# Patient Record
Sex: Male | Born: 1960 | Race: Black or African American | Hispanic: No | Marital: Married | Smoking: Former smoker
Health system: Southern US, Community
[De-identification: ages and names within clinical notes are randomized; demographics above are authoritative.]

## PROBLEM LIST (undated history)

## (undated) DIAGNOSIS — J984 Other disorders of lung: Secondary | ICD-10-CM

## (undated) DIAGNOSIS — C109 Malignant neoplasm of oropharynx, unspecified: Principal | ICD-10-CM

## (undated) HISTORY — DX: Malignant neoplasm of oropharynx, unspecified: C10.9

## (undated) HISTORY — PX: COLONOSCOPY: SHX174

## (undated) HISTORY — PX: DENTAL RESTORATION/EXTRACTION WITH X-RAY: SHX5796

## (undated) HISTORY — DX: Other disorders of lung: J98.4

## (undated) HISTORY — PX: PORT-A-CATH REMOVAL: SHX5289

## (undated) HISTORY — PX: PEG TUBE REMOVAL: SHX2187

---

## 2009-06-03 ENCOUNTER — Emergency Department (HOSPITAL_COMMUNITY): Admission: EM | Admit: 2009-06-03 | Discharge: 2009-06-03 | Payer: Self-pay | Admitting: Emergency Medicine

## 2009-08-13 ENCOUNTER — Ambulatory Visit: Admission: RE | Admit: 2009-08-13 | Discharge: 2009-10-14 | Payer: Self-pay | Admitting: Radiation Oncology

## 2009-08-17 ENCOUNTER — Ambulatory Visit: Payer: Self-pay | Admitting: Dentistry

## 2009-08-17 ENCOUNTER — Encounter: Admission: AD | Admit: 2009-08-17 | Discharge: 2009-08-17 | Payer: Self-pay | Admitting: Dentistry

## 2009-08-17 ENCOUNTER — Ambulatory Visit: Payer: Self-pay | Admitting: Oncology

## 2009-08-20 ENCOUNTER — Ambulatory Visit (HOSPITAL_COMMUNITY): Admission: RE | Admit: 2009-08-20 | Discharge: 2009-08-21 | Payer: Self-pay | Admitting: Dentistry

## 2009-08-24 ENCOUNTER — Ambulatory Visit (HOSPITAL_COMMUNITY): Admission: RE | Admit: 2009-08-24 | Discharge: 2009-08-24 | Payer: Self-pay | Admitting: Oncology

## 2009-08-31 ENCOUNTER — Ambulatory Visit (HOSPITAL_COMMUNITY): Admission: RE | Admit: 2009-08-31 | Discharge: 2009-08-31 | Payer: Self-pay | Admitting: Oncology

## 2009-09-01 ENCOUNTER — Ambulatory Visit (HOSPITAL_COMMUNITY): Admission: RE | Admit: 2009-09-01 | Discharge: 2009-09-01 | Payer: Self-pay | Admitting: Oncology

## 2009-09-17 ENCOUNTER — Ambulatory Visit: Payer: Self-pay | Admitting: Oncology

## 2009-09-21 LAB — CBC WITH DIFFERENTIAL/PLATELET
BASO%: 0.1 % (ref 0.0–2.0)
EOS%: 0.7 % (ref 0.0–7.0)
HCT: 40.5 % (ref 38.4–49.9)
LYMPH%: 5.8 % — ABNORMAL LOW (ref 14.0–49.0)
MCH: 26.4 pg — ABNORMAL LOW (ref 27.2–33.4)
MCHC: 32.3 g/dL (ref 32.0–36.0)
NEUT%: 84.1 % — ABNORMAL HIGH (ref 39.0–75.0)
RBC: 4.96 10*6/uL (ref 4.20–5.82)
lymph#: 0.8 10*3/uL — ABNORMAL LOW (ref 0.9–3.3)

## 2009-09-21 LAB — COMPREHENSIVE METABOLIC PANEL
ALT: 27 U/L (ref 0–53)
AST: 27 U/L (ref 0–37)
Calcium: 11.1 mg/dL — ABNORMAL HIGH (ref 8.4–10.5)
Chloride: 100 mEq/L (ref 96–112)
Creatinine, Ser: 0.62 mg/dL (ref 0.40–1.50)
Potassium: 3.9 mEq/L (ref 3.5–5.3)
Sodium: 137 mEq/L (ref 135–145)
Total Protein: 7.8 g/dL (ref 6.0–8.3)

## 2009-09-24 ENCOUNTER — Ambulatory Visit (HOSPITAL_COMMUNITY): Admission: RE | Admit: 2009-09-24 | Discharge: 2009-09-24 | Payer: Self-pay | Admitting: Oncology

## 2009-09-28 LAB — COMPREHENSIVE METABOLIC PANEL
AST: 18 U/L (ref 0–37)
Albumin: 3.6 g/dL (ref 3.5–5.2)
Alkaline Phosphatase: 101 U/L (ref 39–117)
Potassium: 4.4 mEq/L (ref 3.5–5.3)
Sodium: 137 mEq/L (ref 135–145)
Total Protein: 6.5 g/dL (ref 6.0–8.3)

## 2009-09-28 LAB — CBC WITH DIFFERENTIAL/PLATELET
BASO%: 0.2 % (ref 0.0–2.0)
EOS%: 1.3 % (ref 0.0–7.0)
HGB: 12.3 g/dL — ABNORMAL LOW (ref 13.0–17.1)
MCH: 26.3 pg — ABNORMAL LOW (ref 27.2–33.4)
MCHC: 32.5 g/dL (ref 32.0–36.0)
RDW: 12.9 % (ref 11.0–14.6)
lymph#: 0.4 10*3/uL — ABNORMAL LOW (ref 0.9–3.3)

## 2009-10-07 ENCOUNTER — Ambulatory Visit (HOSPITAL_COMMUNITY): Admission: RE | Admit: 2009-10-07 | Discharge: 2009-10-07 | Payer: Self-pay | Admitting: Oncology

## 2009-10-08 LAB — COMPREHENSIVE METABOLIC PANEL
AST: 31 U/L (ref 0–37)
Albumin: 3.5 g/dL (ref 3.5–5.2)
Alkaline Phosphatase: 118 U/L — ABNORMAL HIGH (ref 39–117)
BUN: 12 mg/dL (ref 6–23)
Potassium: 4.1 mEq/L (ref 3.5–5.3)
Total Bilirubin: 0.4 mg/dL (ref 0.3–1.2)

## 2009-10-08 LAB — CBC WITH DIFFERENTIAL/PLATELET
Basophils Absolute: 0 10*3/uL (ref 0.0–0.1)
EOS%: 0.1 % (ref 0.0–7.0)
HGB: 11.3 g/dL — ABNORMAL LOW (ref 13.0–17.1)
MCH: 27 pg — ABNORMAL LOW (ref 27.2–33.4)
MCV: 81.6 fL (ref 79.3–98.0)
MONO%: 9.3 % (ref 0.0–14.0)
RBC: 4.21 10*6/uL (ref 4.20–5.82)
RDW: 13 % (ref 11.0–14.6)

## 2009-10-19 ENCOUNTER — Ambulatory Visit: Payer: Self-pay | Admitting: Oncology

## 2009-10-19 LAB — COMPREHENSIVE METABOLIC PANEL
CO2: 28 mEq/L (ref 19–32)
Calcium: 8.9 mg/dL (ref 8.4–10.5)
Chloride: 104 mEq/L (ref 96–112)
Creatinine, Ser: 0.47 mg/dL (ref 0.40–1.50)
Sodium: 137 mEq/L (ref 135–145)
Total Bilirubin: 0.7 mg/dL (ref 0.3–1.2)
Total Protein: 6.2 g/dL (ref 6.0–8.3)

## 2009-10-19 LAB — CBC WITH DIFFERENTIAL/PLATELET
BASO%: 0.2 % (ref 0.0–2.0)
Eosinophils Absolute: 0 10*3/uL (ref 0.0–0.5)
HCT: 37.3 % — ABNORMAL LOW (ref 38.4–49.9)
MCH: 26.5 pg — ABNORMAL LOW (ref 27.2–33.4)
MCV: 83.8 fL (ref 79.3–98.0)
MONO%: 7.8 % (ref 0.0–14.0)
NEUT#: 4.3 10*3/uL (ref 1.5–6.5)
NEUT%: 77 % — ABNORMAL HIGH (ref 39.0–75.0)
Platelets: 197 10*3/uL (ref 140–400)
WBC: 5.5 10*3/uL (ref 4.0–10.3)

## 2009-11-09 LAB — COMPREHENSIVE METABOLIC PANEL
ALT: 14 U/L (ref 0–53)
AST: 23 U/L (ref 0–37)
Alkaline Phosphatase: 99 U/L (ref 39–117)
CO2: 26 mEq/L (ref 19–32)
Chloride: 106 mEq/L (ref 96–112)
Creatinine, Ser: 0.57 mg/dL (ref 0.40–1.50)
Glucose, Bld: 109 mg/dL — ABNORMAL HIGH (ref 70–99)
Total Protein: 6.1 g/dL (ref 6.0–8.3)

## 2009-11-09 LAB — CBC WITH DIFFERENTIAL/PLATELET
BASO%: 0.1 % (ref 0.0–2.0)
Basophils Absolute: 0 10*3/uL (ref 0.0–0.1)
EOS%: 0.4 % (ref 0.0–7.0)
HGB: 11.4 g/dL — ABNORMAL LOW (ref 13.0–17.1)
LYMPH%: 9.3 % — ABNORMAL LOW (ref 14.0–49.0)
MCV: 85.5 fL (ref 79.3–98.0)
MONO%: 9.5 % (ref 0.0–14.0)
NEUT%: 80.7 % — ABNORMAL HIGH (ref 39.0–75.0)
RBC: 4.2 10*6/uL (ref 4.20–5.82)
RDW: 17.7 % — ABNORMAL HIGH (ref 11.0–14.6)
WBC: 7.3 10*3/uL (ref 4.0–10.3)
lymph#: 0.7 10*3/uL — ABNORMAL LOW (ref 0.9–3.3)

## 2009-11-11 ENCOUNTER — Ambulatory Visit: Admission: RE | Admit: 2009-11-11 | Discharge: 2010-01-26 | Payer: Self-pay | Admitting: Radiation Oncology

## 2009-11-19 ENCOUNTER — Ambulatory Visit (HOSPITAL_COMMUNITY): Admission: RE | Admit: 2009-11-19 | Discharge: 2009-11-19 | Payer: Self-pay | Admitting: Radiation Oncology

## 2009-12-10 ENCOUNTER — Ambulatory Visit: Payer: Self-pay | Admitting: Oncology

## 2009-12-10 LAB — CBC WITH DIFFERENTIAL/PLATELET
BASO%: 0 % (ref 0.0–2.0)
EOS%: 5.5 % (ref 0.0–7.0)
HCT: 35.7 % — ABNORMAL LOW (ref 38.4–49.9)
MCH: 28.1 pg (ref 27.2–33.4)
MCHC: 32.5 g/dL (ref 32.0–36.0)
NEUT%: 71.7 % (ref 39.0–75.0)
RBC: 4.13 10*6/uL — ABNORMAL LOW (ref 4.20–5.82)
WBC: 4.7 10*3/uL (ref 4.0–10.3)
lymph#: 0.6 10*3/uL — ABNORMAL LOW (ref 0.9–3.3)
nRBC: 0 % (ref 0–0)

## 2009-12-10 LAB — COMPREHENSIVE METABOLIC PANEL
ALT: 8 U/L (ref 0–53)
AST: 16 U/L (ref 0–37)
Alkaline Phosphatase: 91 U/L (ref 39–117)
BUN: 13 mg/dL (ref 6–23)
Creatinine, Ser: 0.64 mg/dL (ref 0.40–1.50)
Potassium: 4.1 mEq/L (ref 3.5–5.3)

## 2009-12-17 LAB — COMPREHENSIVE METABOLIC PANEL
ALT: 11 U/L (ref 0–53)
Albumin: 4.4 g/dL (ref 3.5–5.2)
Alkaline Phosphatase: 83 U/L (ref 39–117)
CO2: 22 mEq/L (ref 19–32)
Calcium: 9 mg/dL (ref 8.4–10.5)
Creatinine, Ser: 1.83 mg/dL — ABNORMAL HIGH (ref 0.40–1.50)

## 2009-12-17 LAB — CBC WITH DIFFERENTIAL/PLATELET
BASO%: 0.2 % (ref 0.0–2.0)
Basophils Absolute: 0 10*3/uL (ref 0.0–0.1)
EOS%: 3.5 % (ref 0.0–7.0)
Eosinophils Absolute: 0.2 10*3/uL (ref 0.0–0.5)
MCH: 28.6 pg (ref 27.2–33.4)
MCV: 87.3 fL (ref 79.3–98.0)
MONO%: 9.5 % (ref 0.0–14.0)
NEUT%: 77.4 % — ABNORMAL HIGH (ref 39.0–75.0)
lymph#: 0.5 10*3/uL — ABNORMAL LOW (ref 0.9–3.3)
nRBC: 0 % (ref 0–0)

## 2009-12-23 LAB — CBC WITH DIFFERENTIAL/PLATELET
Eosinophils Absolute: 0.3 10*3/uL (ref 0.0–0.5)
HCT: 36 % — ABNORMAL LOW (ref 38.4–49.9)
HGB: 12.1 g/dL — ABNORMAL LOW (ref 13.0–17.1)
LYMPH%: 5.2 % — ABNORMAL LOW (ref 14.0–49.0)
MCH: 29.9 pg (ref 27.2–33.4)
MCHC: 33.7 g/dL (ref 32.0–36.0)
MONO#: 0.5 10*3/uL (ref 0.1–0.9)
NEUT#: 4.1 10*3/uL (ref 1.5–6.5)
NEUT%: 80.5 % — ABNORMAL HIGH (ref 39.0–75.0)

## 2009-12-23 LAB — COMPREHENSIVE METABOLIC PANEL
ALT: 8 U/L (ref 0–53)
AST: 13 U/L (ref 0–37)
Alkaline Phosphatase: 80 U/L (ref 39–117)
Potassium: 4 mEq/L (ref 3.5–5.3)
Sodium: 140 mEq/L (ref 135–145)
Total Bilirubin: 0.6 mg/dL (ref 0.3–1.2)

## 2009-12-23 LAB — MAGNESIUM: Magnesium: 1.9 mg/dL (ref 1.5–2.5)

## 2009-12-30 LAB — CBC WITH DIFFERENTIAL/PLATELET
BASO%: 0 % (ref 0.0–2.0)
Basophils Absolute: 0 10*3/uL (ref 0.0–0.1)
EOS%: 4.6 % (ref 0.0–7.0)
HGB: 12 g/dL — ABNORMAL LOW (ref 13.0–17.1)
LYMPH%: 3.9 % — ABNORMAL LOW (ref 14.0–49.0)
MCH: 29.9 pg (ref 27.2–33.4)
MCHC: 33.3 g/dL (ref 32.0–36.0)
MONO%: 10 % (ref 0.0–14.0)
NEUT%: 81.5 % — ABNORMAL HIGH (ref 39.0–75.0)
Platelets: 145 10*3/uL (ref 140–400)
RBC: 4.01 10*6/uL — ABNORMAL LOW (ref 4.20–5.82)
RDW: 14.1 % (ref 11.0–14.6)

## 2009-12-30 LAB — COMPREHENSIVE METABOLIC PANEL
ALT: 10 U/L (ref 0–53)
AST: 14 U/L (ref 0–37)
Creatinine, Ser: 0.7 mg/dL (ref 0.40–1.50)
Glucose, Bld: 90 mg/dL (ref 70–99)
Potassium: 4.2 mEq/L (ref 3.5–5.3)
Total Protein: 6.9 g/dL (ref 6.0–8.3)

## 2009-12-30 LAB — MAGNESIUM: Magnesium: 1.9 mg/dL (ref 1.5–2.5)

## 2010-01-04 ENCOUNTER — Ambulatory Visit: Payer: Self-pay | Admitting: Oncology

## 2010-01-06 ENCOUNTER — Ambulatory Visit: Payer: Self-pay | Admitting: Dentistry

## 2010-01-06 LAB — CBC WITH DIFFERENTIAL/PLATELET
BASO%: 0 % (ref 0.0–2.0)
EOS%: 4.1 % (ref 0.0–7.0)
Eosinophils Absolute: 0.2 10*3/uL (ref 0.0–0.5)
HCT: 34.9 % — ABNORMAL LOW (ref 38.4–49.9)
HGB: 11.9 g/dL — ABNORMAL LOW (ref 13.0–17.1)
LYMPH%: 2.3 % — ABNORMAL LOW (ref 14.0–49.0)
MONO#: 0.3 10*3/uL (ref 0.1–0.9)
MONO%: 6.4 % (ref 0.0–14.0)
NEUT%: 87.2 % — ABNORMAL HIGH (ref 39.0–75.0)
Platelets: 112 10*3/uL — ABNORMAL LOW (ref 140–400)
WBC: 5.4 10*3/uL (ref 4.0–10.3)
lymph#: 0.1 10*3/uL — ABNORMAL LOW (ref 0.9–3.3)

## 2010-01-06 LAB — COMPREHENSIVE METABOLIC PANEL
ALT: 10 U/L (ref 0–53)
Alkaline Phosphatase: 73 U/L (ref 39–117)
CO2: 28 mEq/L (ref 19–32)
Potassium: 4.3 mEq/L (ref 3.5–5.3)
Total Protein: 6.5 g/dL (ref 6.0–8.3)

## 2010-01-13 LAB — COMPREHENSIVE METABOLIC PANEL
ALT: 9 U/L (ref 0–53)
AST: 14 U/L (ref 0–37)
Albumin: 4.3 g/dL (ref 3.5–5.2)
Alkaline Phosphatase: 81 U/L (ref 39–117)
BUN: 14 mg/dL (ref 6–23)
CO2: 24 mEq/L (ref 19–32)
Calcium: 9 mg/dL (ref 8.4–10.5)
Glucose, Bld: 126 mg/dL — ABNORMAL HIGH (ref 70–99)
Sodium: 140 mEq/L (ref 135–145)
Total Bilirubin: 0.6 mg/dL (ref 0.3–1.2)
Total Protein: 6.9 g/dL (ref 6.0–8.3)

## 2010-01-13 LAB — CBC WITH DIFFERENTIAL/PLATELET
BASO%: 0 % (ref 0.0–2.0)
Basophils Absolute: 0 10*3/uL (ref 0.0–0.1)
Eosinophils Absolute: 0.1 10*3/uL (ref 0.0–0.5)
NEUT#: 5.3 10*3/uL (ref 1.5–6.5)
Platelets: 84 10*3/uL — ABNORMAL LOW (ref 140–400)
lymph#: 0.1 10*3/uL — ABNORMAL LOW (ref 0.9–3.3)

## 2010-01-13 LAB — MAGNESIUM: Magnesium: 1.9 mg/dL (ref 1.5–2.5)

## 2010-01-20 LAB — CBC WITH DIFFERENTIAL/PLATELET
BASO%: 0 % (ref 0.0–2.0)
Eosinophils Absolute: 0.1 10*3/uL (ref 0.0–0.5)
HCT: 32.9 % — ABNORMAL LOW (ref 38.4–49.9)
HGB: 11.4 g/dL — ABNORMAL LOW (ref 13.0–17.1)
MCH: 30.7 pg (ref 27.2–33.4)
MCV: 88.7 fL (ref 79.3–98.0)
MONO#: 0.2 10*3/uL (ref 0.1–0.9)
MONO%: 8 % (ref 0.0–14.0)
NEUT%: 86.1 % — ABNORMAL HIGH (ref 39.0–75.0)
lymph#: 0.1 10*3/uL — ABNORMAL LOW (ref 0.9–3.3)

## 2010-01-20 LAB — COMPREHENSIVE METABOLIC PANEL
ALT: 9 U/L (ref 0–53)
AST: 16 U/L (ref 0–37)
BUN: 15 mg/dL (ref 6–23)
Calcium: 9.2 mg/dL (ref 8.4–10.5)
Glucose, Bld: 88 mg/dL (ref 70–99)
Potassium: 3.9 mEq/L (ref 3.5–5.3)
Sodium: 138 mEq/L (ref 135–145)
Total Protein: 6.5 g/dL (ref 6.0–8.3)

## 2010-02-17 ENCOUNTER — Ambulatory Visit: Payer: Self-pay | Admitting: Oncology

## 2010-02-18 LAB — CBC WITH DIFFERENTIAL/PLATELET
BASO%: 0 % (ref 0.0–2.0)
EOS%: 0.6 % (ref 0.0–7.0)
Eosinophils Absolute: 0 10*3/uL (ref 0.0–0.5)
HCT: 35.4 % — ABNORMAL LOW (ref 38.4–49.9)
HGB: 12 g/dL — ABNORMAL LOW (ref 13.0–17.1)
MCH: 30.5 pg (ref 27.2–33.4)
MCHC: 33.8 g/dL (ref 32.0–36.0)
MONO#: 0.5 10*3/uL (ref 0.1–0.9)
NEUT#: 5.6 10*3/uL (ref 1.5–6.5)
WBC: 6.4 10*3/uL (ref 4.0–10.3)

## 2010-02-18 LAB — COMPREHENSIVE METABOLIC PANEL
AST: 16 U/L (ref 0–37)
Calcium: 9.4 mg/dL (ref 8.4–10.5)
Creatinine, Ser: 0.75 mg/dL (ref 0.40–1.50)
Sodium: 138 mEq/L (ref 135–145)
Total Protein: 7.1 g/dL (ref 6.0–8.3)

## 2010-03-16 ENCOUNTER — Ambulatory Visit: Payer: Self-pay | Admitting: Dentistry

## 2010-04-26 ENCOUNTER — Ambulatory Visit: Payer: Self-pay | Admitting: Oncology

## 2010-04-27 ENCOUNTER — Encounter: Payer: Self-pay | Admitting: Pulmonary Disease

## 2010-04-27 ENCOUNTER — Ambulatory Visit (HOSPITAL_COMMUNITY): Admission: RE | Admit: 2010-04-27 | Discharge: 2010-04-27 | Payer: Self-pay | Admitting: Oncology

## 2010-04-28 ENCOUNTER — Encounter: Payer: Self-pay | Admitting: Pulmonary Disease

## 2010-04-28 LAB — CBC WITH DIFFERENTIAL/PLATELET
EOS%: 3.5 % (ref 0.0–7.0)
Eosinophils Absolute: 0.2 10*3/uL (ref 0.0–0.5)
HCT: 40.9 % (ref 38.4–49.9)
HGB: 13.6 g/dL (ref 13.0–17.1)
MCV: 85.4 fL (ref 79.3–98.0)
MONO%: 6.1 % (ref 0.0–14.0)
NEUT#: 4.2 10*3/uL (ref 1.5–6.5)
NEUT%: 83.1 % — ABNORMAL HIGH (ref 39.0–75.0)
Platelets: 170 10*3/uL (ref 140–400)
lymph#: 0.4 10*3/uL — ABNORMAL LOW (ref 0.9–3.3)

## 2010-04-28 LAB — COMPREHENSIVE METABOLIC PANEL
ALT: 11 U/L (ref 0–53)
Albumin: 4.5 g/dL (ref 3.5–5.2)
BUN: 13 mg/dL (ref 6–23)
Calcium: 9.7 mg/dL (ref 8.4–10.5)
Glucose, Bld: 169 mg/dL — ABNORMAL HIGH (ref 70–99)
Sodium: 139 mEq/L (ref 135–145)
Total Bilirubin: 0.5 mg/dL (ref 0.3–1.2)

## 2010-05-17 ENCOUNTER — Ambulatory Visit: Payer: Self-pay | Admitting: Pulmonary Disease

## 2010-05-17 DIAGNOSIS — R599 Enlarged lymph nodes, unspecified: Secondary | ICD-10-CM | POA: Insufficient documentation

## 2010-05-18 DIAGNOSIS — C109 Malignant neoplasm of oropharynx, unspecified: Secondary | ICD-10-CM

## 2010-05-18 DIAGNOSIS — J984 Other disorders of lung: Secondary | ICD-10-CM

## 2010-05-18 DIAGNOSIS — Z85818 Personal history of malignant neoplasm of other sites of lip, oral cavity, and pharynx: Secondary | ICD-10-CM | POA: Insufficient documentation

## 2010-05-18 HISTORY — DX: Malignant neoplasm of oropharynx, unspecified: C10.9

## 2010-05-18 HISTORY — DX: Other disorders of lung: J98.4

## 2010-05-19 ENCOUNTER — Ambulatory Visit (HOSPITAL_COMMUNITY): Admission: RE | Admit: 2010-05-19 | Discharge: 2010-05-19 | Payer: Self-pay | Admitting: Oncology

## 2010-07-26 ENCOUNTER — Ambulatory Visit: Payer: Self-pay | Admitting: Oncology

## 2010-08-04 ENCOUNTER — Ambulatory Visit (HOSPITAL_COMMUNITY): Admission: RE | Admit: 2010-08-04 | Discharge: 2010-08-04 | Payer: Self-pay | Admitting: Oncology

## 2010-08-09 ENCOUNTER — Encounter: Payer: Self-pay | Admitting: Pulmonary Disease

## 2010-08-09 LAB — COMPREHENSIVE METABOLIC PANEL
ALT: 12 U/L (ref 0–53)
Alkaline Phosphatase: 108 U/L (ref 39–117)
BUN: 16 mg/dL (ref 6–23)
Calcium: 9.3 mg/dL (ref 8.4–10.5)

## 2010-08-09 LAB — CBC WITH DIFFERENTIAL/PLATELET
BASO%: 0 % (ref 0.0–2.0)
Basophils Absolute: 0 10*3/uL (ref 0.0–0.1)
EOS%: 3.7 % (ref 0.0–7.0)
Eosinophils Absolute: 0.3 10*3/uL (ref 0.0–0.5)
HCT: 37.9 % — ABNORMAL LOW (ref 38.4–49.9)
MCHC: 33.5 g/dL (ref 32.0–36.0)
NEUT%: 84.6 % — ABNORMAL HIGH (ref 39.0–75.0)
Platelets: 164 10*3/uL (ref 140–400)
RBC: 4.5 10*6/uL (ref 4.20–5.82)
RDW: 13.3 % (ref 11.0–14.6)

## 2010-10-29 ENCOUNTER — Encounter: Payer: Self-pay | Admitting: Pulmonary Disease

## 2010-11-05 ENCOUNTER — Other Ambulatory Visit: Payer: Self-pay | Admitting: Oncology

## 2010-11-16 NOTE — Letter (Signed)
Summary: [facility]  [facility]   Imported By: Sherian Rein 08/30/2010 13:21:20  _____________________________________________________________________  External Attachment:    Type:   Image     Comment:   External Document

## 2010-11-16 NOTE — Letter (Signed)
[facility]   Imported By: Sherian Rein 05/14/2010 10:38:26  _____________________________________________________________________  External Attachment:    Type:   Image     Comment:   External Document

## 2010-11-16 NOTE — Assessment & Plan Note (Signed)
Summary: hist. of throat ca/bilateral lung nodules/apc   Visit Type:  Initial Consult Copy to:  DR HA  CC:  2 SMALL LYMPH NODULES INFLAMMED and FOLLOWUP FROM PET SCAN.  History of Present Illness: 50/M, ex smoker, for evaluation of mediastinal LNS. 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 he has a PEG & eats abou 0 soid food now. he has lost 20 lbs . PET scan on 04/27/10 reviewed, showed no evidence of residual tumor at the base of tongue, sub-cm rt posterior cervical LN low level ptake, UL 8 mm ground glass nodule SU 1.4, new bilateral symmetric DG uptake booth hila, lt 5.4 & Rt 3.6. Interval calcification of mediastinal & hilar nodes was noted. He reports sore throat & dryness of mouth. No skin rash   Preventive Screening-Counseling & Management  Alcohol-Tobacco     Smoking Status: quit  Current Medications (verified): 1)  Percocet 5-325 Mg Tabs (Oxycodone-Acetaminophen) .... As Needed  Allergies (verified): No Known Drug Allergies  Past History:  Past Medical History: CANCER  ORAL PHRYNX 08/11/09 CHEMO THERAPY AND RADIATION FROM 12/11 TO APRIL 4/11  Past Surgical History: ORAL SURGERYTEETH REMOVED 08/20/09 FOR CHEMOTHERAPY AND RADIATION TREATMENT  Family History: GASTRIC CA/FATHER Family History HypertensionFATHER Family History Lung Cancer GRANDFATHER  Social History: Patient states former smoker. 15 YEARS AGO Patient states former smoker.  Marital Status: MARRIED Children: YES Occupation: FULL TIME STUDENT Smoking Status:  quit  Review of Systems       The patient complains of non-productive cough and tooth/dental problems.  The patient denies shortness of breath with activity, shortness of breath at rest, productive cough, coughing up blood, chest pain, irregular heartbeats, acid heartburn, indigestion, loss of appetite, weight change, abdominal pain, difficulty swallowing, sore  throat, headaches, nasal congestion/difficulty breathing through nose, sneezing, itching, ear ache, anxiety, depression, hand/feet swelling, joint stiffness or pain, rash, change in color of mucus, and fever.    Vital Signs:  Patient profile:   50 year old male Height:      71 inches Weight:      172 pounds BMI:     24.08 O2 Sat:      97 % on Room air Temp:     98.1 degrees F oral Pulse rate:   73 / minute BP sitting:   96 / 68  (right arm) Cuff size:   regular  Vitals Entered By: Kandice Hams CMA (May 17, 2010 11:29 AM)  O2 Flow:  Room air  Physical Exam  Additional Exam:  Gen. Pleasant, well-nourished, in no distress, normal affect ENT - no lesions, no post nasal drip Neck: No JVD, no thyromegaly, no carotid bruits Lungs: no use of accessory muscles, no dullness to percussion, clear without rales or rhonchi  Cardiovascular: Rhythm regular, heart sounds  normal, no murmurs or gallops, no peripheral edema Abdomen: soft and non-tender, no hepatosplenomegaly, BS normal, pEG site clean Musculoskeletal: No deformities, no cyanosis or clubbing Neuro:  alert, non focal     Impression & Recommendations:  Problem # 1:  LYMPHADENOPATHY (ICD-785.6)  Calcification of mediastinal & hilar LNs favors a benign process - old infection, inflammation ? sarcoid Agree with obtaining ACE level  Orders: Consultation Level IV (16109)  Problem # 2:  PULMONARY NODULE, RIGHT UPPER LOBE (ICD-518.89)  Agree with 3-4 month FU with contrast CT or PET  Orders: Consultation Level IV (60454)  Medications Added to Medication List  This Visit: 1)  Percocet 5-325 Mg Tabs (Oxycodone-acetaminophen) .... As needed  Patient Instructions: 1)  Copy sent to:Dr Ha 2)  Please schedule a follow-up appointment in 3 months after scan to scheck on spot in right uppe rlung, neck nodes & chest lymph glands 3)  Obtian blood work from Dr Gaylyn Rong 4)  The lymph glands are calcified - this can indicate old infection,  inflammation or sarcoidosis

## 2010-11-18 NOTE — Letter (Signed)
Summary: [facility]  [facility]   Imported By: Sherian Rein 11/12/2010 07:53:05  _____________________________________________________________________  External Attachment:    Type:   Image     Comment:   External Document

## 2010-11-29 ENCOUNTER — Other Ambulatory Visit (HOSPITAL_COMMUNITY): Payer: Self-pay

## 2010-12-01 ENCOUNTER — Other Ambulatory Visit: Payer: Self-pay | Admitting: Oncology

## 2010-12-03 ENCOUNTER — Ambulatory Visit (HOSPITAL_COMMUNITY)
Admission: RE | Admit: 2010-12-03 | Discharge: 2010-12-03 | Disposition: A | Discharge status: Home / Self Care | Payer: PRIVATE HEALTH INSURANCE | Source: Ambulatory Visit | Attending: Oncology | Admitting: Oncology

## 2010-12-03 DIAGNOSIS — J438 Other emphysema: Secondary | ICD-10-CM | POA: Insufficient documentation

## 2010-12-03 DIAGNOSIS — C760 Malignant neoplasm of head, face and neck: Secondary | ICD-10-CM | POA: Insufficient documentation

## 2010-12-03 MED ORDER — IOHEXOL 300 MG/ML  SOLN
100.0000 mL | Freq: Once | INTRAMUSCULAR | Status: AC | PRN
  Administered 2010-12-03: 100 mL via INTRAVENOUS

## 2010-12-06 ENCOUNTER — Encounter (HOSPITAL_BASED_OUTPATIENT_CLINIC_OR_DEPARTMENT_OTHER): Payer: PRIVATE HEALTH INSURANCE | Admitting: Oncology

## 2010-12-06 ENCOUNTER — Encounter: Payer: Self-pay | Admitting: Pulmonary Disease

## 2010-12-06 DIAGNOSIS — B370 Candidal stomatitis: Secondary | ICD-10-CM

## 2010-12-06 DIAGNOSIS — C01 Malignant neoplasm of base of tongue: Secondary | ICD-10-CM

## 2010-12-06 DIAGNOSIS — Z23 Encounter for immunization: Secondary | ICD-10-CM

## 2010-12-31 LAB — CBC
HCT: 39.7 % (ref 39.0–52.0)
Hemoglobin: 13.3 g/dL (ref 13.0–17.0)
MCH: 28.8 pg (ref 26.0–34.0)
MCHC: 33.6 g/dL (ref 30.0–36.0)

## 2011-01-04 NOTE — Letter (Signed)
Summary: [facility]  [facility]   Imported By: Lennie Odor 12/30/2010 12:16:23  _____________________________________________________________________  External Attachment:    Type:   Image     Comment:   External Document

## 2011-01-19 LAB — CBC
HCT: 33.5 % — ABNORMAL LOW (ref 39.0–52.0)
HCT: 39.8 % (ref 39.0–52.0)
MCHC: 33.5 g/dL (ref 30.0–36.0)
MCV: 80.7 fL (ref 78.0–100.0)
Platelets: 252 10*3/uL (ref 150–400)
Platelets: 293 10*3/uL (ref 150–400)
RBC: 4.14 MIL/uL — ABNORMAL LOW (ref 4.22–5.81)
RDW: 12.5 % (ref 11.5–15.5)
WBC: 7.5 10*3/uL (ref 4.0–10.5)

## 2011-01-19 LAB — CULTURE, BLOOD (ROUTINE X 2): Culture: NO GROWTH

## 2011-01-19 LAB — COMPREHENSIVE METABOLIC PANEL
Albumin: 3.8 g/dL (ref 3.5–5.2)
BUN: 13 mg/dL (ref 6–23)
Calcium: 9.6 mg/dL (ref 8.4–10.5)
Creatinine, Ser: 0.76 mg/dL (ref 0.4–1.5)
Total Bilirubin: 0.5 mg/dL (ref 0.3–1.2)
Total Protein: 6.9 g/dL (ref 6.0–8.3)

## 2011-01-19 LAB — BASIC METABOLIC PANEL
BUN: 8 mg/dL (ref 6–23)
Chloride: 102 mEq/L (ref 96–112)
Creatinine, Ser: 0.76 mg/dL (ref 0.4–1.5)
GFR calc Af Amer: >60 mL/min (ref >60)
GFR calc non Af Amer: >60 mL/min (ref >60)
Potassium: 3.9 mEq/L (ref 3.5–5.1)

## 2011-01-19 LAB — DIFFERENTIAL
Basophils Absolute: 0 10*3/uL (ref 0.0–0.1)
Basophils Relative: 0 % (ref 0–1)
Lymphocytes Relative: 4 % — ABNORMAL LOW (ref 12–46)
Monocytes Absolute: 0.5 10*3/uL (ref 0.1–1.0)
Neutro Abs: 12.2 10*3/uL — ABNORMAL HIGH (ref 1.7–7.7)
Neutrophils Relative %: 91 % — ABNORMAL HIGH (ref 43–77)

## 2011-01-19 LAB — APTT: aPTT: 36 seconds (ref 24–37)

## 2011-01-19 LAB — PROTIME-INR: Prothrombin Time: 14.4 seconds (ref 11.6–15.2)

## 2011-01-19 LAB — GLUCOSE, CAPILLARY: Glucose-Capillary: 128 mg/dL — ABNORMAL HIGH (ref 70–99)

## 2011-03-28 ENCOUNTER — Encounter (HOSPITAL_BASED_OUTPATIENT_CLINIC_OR_DEPARTMENT_OTHER): Payer: PRIVATE HEALTH INSURANCE | Admitting: Oncology

## 2011-03-28 ENCOUNTER — Other Ambulatory Visit: Payer: Self-pay | Admitting: Oncology

## 2011-03-28 ENCOUNTER — Ambulatory Visit (HOSPITAL_COMMUNITY)
Admission: RE | Admit: 2011-03-28 | Discharge: 2011-03-28 | Disposition: A | Discharge status: Home / Self Care | Payer: PRIVATE HEALTH INSURANCE | Source: Ambulatory Visit | Attending: Oncology | Admitting: Oncology

## 2011-03-28 DIAGNOSIS — C01 Malignant neoplasm of base of tongue: Secondary | ICD-10-CM

## 2011-03-28 DIAGNOSIS — C029 Malignant neoplasm of tongue, unspecified: Secondary | ICD-10-CM | POA: Insufficient documentation

## 2011-03-28 LAB — CMP (CANCER CENTER ONLY)
AST: 48 U/L — ABNORMAL HIGH (ref 11–38)
Albumin: 3.5 g/dL (ref 3.3–5.5)
BUN, Bld: 15 mg/dL (ref 7–22)
CO2: 31 mEq/L (ref 18–33)
Calcium: 9.2 mg/dL (ref 8.0–10.3)
Chloride: 101 mEq/L (ref 98–108)
Glucose, Bld: 107 mg/dL (ref 73–118)
Potassium: 4.3 mEq/L (ref 3.3–4.7)

## 2011-03-28 LAB — CBC WITH DIFFERENTIAL/PLATELET
Basophils Absolute: 0 10*3/uL (ref 0.0–0.1)
Eosinophils Absolute: 0.3 10*3/uL (ref 0.0–0.5)
HCT: 39 % (ref 38.4–49.9)
HGB: 13 g/dL (ref 13.0–17.1)
MONO#: 0.4 10*3/uL (ref 0.1–0.9)
NEUT#: 4.4 10*3/uL (ref 1.5–6.5)
NEUT%: 78.9 % — ABNORMAL HIGH (ref 39.0–75.0)
RDW: 13.1 % (ref 11.0–14.6)
lymph#: 0.5 10*3/uL — ABNORMAL LOW (ref 0.9–3.3)

## 2011-03-28 MED ORDER — IOHEXOL 300 MG/ML  SOLN
100.0000 mL | Freq: Once | INTRAMUSCULAR | Status: AC | PRN
  Administered 2011-03-28: 100 mL via INTRAVENOUS

## 2011-04-04 ENCOUNTER — Other Ambulatory Visit: Payer: Self-pay | Admitting: Oncology

## 2011-04-04 ENCOUNTER — Encounter (HOSPITAL_BASED_OUTPATIENT_CLINIC_OR_DEPARTMENT_OTHER): Payer: PRIVATE HEALTH INSURANCE | Admitting: Oncology

## 2011-04-04 DIAGNOSIS — C01 Malignant neoplasm of base of tongue: Secondary | ICD-10-CM

## 2011-04-04 DIAGNOSIS — C029 Malignant neoplasm of tongue, unspecified: Secondary | ICD-10-CM

## 2011-05-13 ENCOUNTER — Ambulatory Visit: Payer: PRIVATE HEALTH INSURANCE | Attending: Radiation Oncology | Admitting: Radiation Oncology

## 2011-07-26 IMAGING — PT NM PET TUM IMG INITIAL (PI) SKULL BASE T - THIGH
4 series · 25 of 25 positions shown · non-contrast
Comparison: None

CLINICAL DATA: Tongue base cancer.

NUCLEAR MEDICINE PET/CT
TECHNIQUE: 13.6 mCi F-18 FDG was injected intravenously.  Full-
ring PET imaging was performed from the skull base through the mid-
thighs.  CT data was obtained and used for attenuation correction
and anatomic localization only.  (This was not acquired as a
diagnostic CT examination.)  Data regarding site of injection,
patient weight, post injection waiting period, and fasting blood
sugar is available in the PACS documentation. Dedicated PET imaging
of the neck region was also performed.  Any standard uptake value
measurements in the neck region are taken from these dedicated neck
images.

[Series 1: pet ac · axial · 3.3mm · 4.69mm/px · z∈[-908,-38]mm · 8 of 267 slices shown]
[im 1/267]
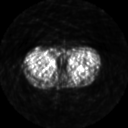
[im 39/267]
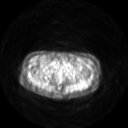
[im 77/267]
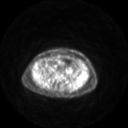
[im 115/267]
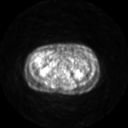
[im 153/267]
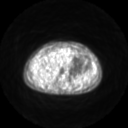
[im 191/267]
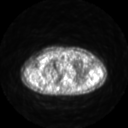
[im 229/267]
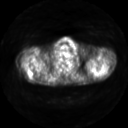
[im 267/267]
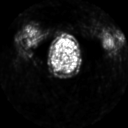

[Series 2: pet nac · axial · 3.3mm · 4.69mm/px · z∈[-908,-38]mm · 8 of 267 slices shown]
[im 1/267]
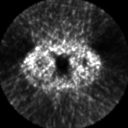
[im 39/267]
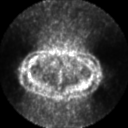
[im 77/267]
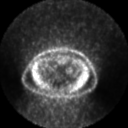
[im 115/267]
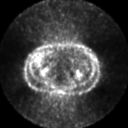
[im 153/267]
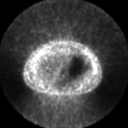
[im 191/267]
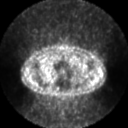
[im 229/267]
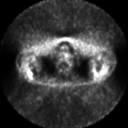
[im 267/267]
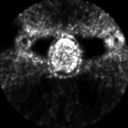

[Series 123: mip · coronal · 3.3mm · 4.69mm/px · 1 of 30 slices shown]
[im 1/30]
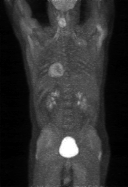

[Series 151: reformatted · axial · 3.3mm · 3.91mm/px · z∈[-908,-38]mm · 8 of 267 slices shown]
[im 1/267]
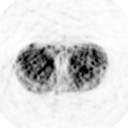
[im 39/267]
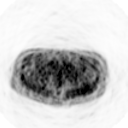
[im 77/267]
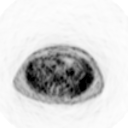
[im 115/267]
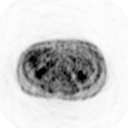
[im 153/267]
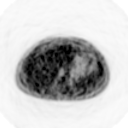
[im 191/267]
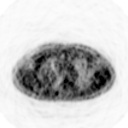
[im 229/267]
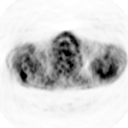
[im 267/267]
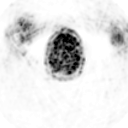

[25 of 25 positions shown; findings below may reference images not displayed]

FINDINGS: A mass in the left tongue base which appears to have
ulceration and central necrosis there has a maximum standard uptake
value of 14.0 and somewhat effaces the left vallecula.  Superiorly,
this mass extends up on the left side of the nasopharynx nearly to
the level of the maxilla.  Posteriorly, this mass extends to the
left side of the pharynx and nearly reaches the anterior margin of
the C2 vertebral body.  Inferiorly, the mass extends nearly to the
hyoid bone.

No definite hyper metabolic adenopathy is identified in the neck,
although lymph nodes measure up to 1.0 cm in short axis dimension
(station II on the left).

There is considerable muscular activity in the neck and left
shoulder, and also of the right first intercostal space.

A hypodense lesion in the medial segment left hepatic lobe
measuring 1.6 cm in diameter is not hypermetabolic and thus is
likely be benign.

An abnormal low density lesion is present along the descending
duodenum, and is intimately associated with the common bile duct
and possibly the pancreatic head.  Differential diagnostic
considerations include a foregut duplication cyst, choledochal
cyst, or a cystic mass exophytic from the head of the pancreas.
MRI of the pancreas with contrast and with dedicated MRCP images is
recommended if this is not a known benign finding.  I would doubt
that this would represent metastatic disease from the head/neck
primary, and the lesion is not overtly hypermetabolic, with a
maximum standard uptake value of 1.5.  However, please note that
low grade cystic neoplasm of the pancreas can have low activity on
PET scan.

No findings of metastatic disease to the pelvis.

There is mild chronic left ethmoid sinusitis.  There is a
suggestion of recent maxillary tooth extractions, with some
cortical indistinctness of the left posterior maxilla.

Patchy ground-glass opacities are present in the right lung.  The
appearance is less striking than on the recent chest radiograph,
and may represent improving pneumonia, asymmetric edema, or
aspiration pneumonitis.
IMPRESSION: 1.  Hypermetabolic left tongue base mass compatible with
malignancy.  No definite hypermetabolic adenopathy in the neck
although individual lymph nodes measure up to 1.0 cm in short axis
dimension.
2.  Patchy ground-glass opacities in the right lung, possibly
representing aspiration pneumonitis, improving pneumonia, or
asymmetric edema.
3.  Cystic lesion along the right margin of the head of the
pancreas, with differential diagnostic considerations including
choledochal cyst, foregut duplication cyst, or cystic mass
exophytic in the head of the pancreas.  Given the appearance I
would doubt that this represents a duodenal diverticulum.  MRI of
the pancreas with and without contrast and with dedicated MRCP
images is recommended if this has not been previously worked up.
4.  Mild chronic left ethmoid sinusitis.
5.  Recent tooth extraction from the maxilla, with some cortical
indistinctness of the left posterior maxilla.
6.  Non-hypermetabolic hypodense lesion in the medial segment left
hepatic lobe is likely benign.

## 2011-09-19 ENCOUNTER — Encounter: Payer: Self-pay | Admitting: *Deleted

## 2011-09-22 ENCOUNTER — Other Ambulatory Visit: Payer: Self-pay | Admitting: *Deleted

## 2011-09-22 DIAGNOSIS — C01 Malignant neoplasm of base of tongue: Secondary | ICD-10-CM

## 2011-09-23 ENCOUNTER — Ambulatory Visit (HOSPITAL_COMMUNITY)
Admission: RE | Admit: 2011-09-23 | Discharge: 2011-09-23 | Disposition: A | Discharge status: Home / Self Care | Payer: PRIVATE HEALTH INSURANCE | Source: Ambulatory Visit | Attending: Oncology | Admitting: Oncology

## 2011-09-23 ENCOUNTER — Other Ambulatory Visit (HOSPITAL_BASED_OUTPATIENT_CLINIC_OR_DEPARTMENT_OTHER): Payer: PRIVATE HEALTH INSURANCE | Admitting: Lab

## 2011-09-23 ENCOUNTER — Other Ambulatory Visit: Payer: Self-pay | Admitting: Oncology

## 2011-09-23 ENCOUNTER — Encounter: Payer: Self-pay | Admitting: Oncology

## 2011-09-23 DIAGNOSIS — C01 Malignant neoplasm of base of tongue: Secondary | ICD-10-CM

## 2011-09-23 DIAGNOSIS — D6959 Other secondary thrombocytopenia: Secondary | ICD-10-CM

## 2011-09-23 DIAGNOSIS — C029 Malignant neoplasm of tongue, unspecified: Secondary | ICD-10-CM

## 2011-09-23 LAB — CMP (CANCER CENTER ONLY)
AST: 29 U/L (ref 11–38)
Alkaline Phosphatase: 84 U/L (ref 26–84)
BUN, Bld: 20 mg/dL (ref 7–22)
Creat: 1.1 mg/dl (ref 0.6–1.2)
Glucose, Bld: 78 mg/dL (ref 73–118)
Total Bilirubin: 0.6 mg/dl (ref 0.20–1.60)

## 2011-09-23 LAB — CBC WITH DIFFERENTIAL/PLATELET
Basophils Absolute: 0 10*3/uL (ref 0.0–0.1)
EOS%: 4.2 % (ref 0.0–7.0)
HCT: 41 % (ref 38.4–49.9)
HGB: 13.6 g/dL (ref 13.0–17.1)
LYMPH%: 13.2 % — ABNORMAL LOW (ref 14.0–49.0)
MCH: 27 pg — ABNORMAL LOW (ref 27.2–33.4)
MCV: 81.5 fL (ref 79.3–98.0)
MONO%: 6.6 % (ref 0.0–14.0)
NEUT%: 75.9 % — ABNORMAL HIGH (ref 39.0–75.0)
Platelets: 174 10*3/uL (ref 140–400)
RDW: 13 % (ref 11.0–14.6)

## 2011-09-23 MED ORDER — IOHEXOL 300 MG/ML  SOLN
100.0000 mL | Freq: Once | INTRAMUSCULAR | Status: AC | PRN
  Administered 2011-09-23: 100 mL via INTRAVENOUS

## 2011-09-29 ENCOUNTER — Telehealth: Payer: Self-pay | Admitting: Oncology

## 2011-09-29 ENCOUNTER — Ambulatory Visit (HOSPITAL_BASED_OUTPATIENT_CLINIC_OR_DEPARTMENT_OTHER): Payer: PRIVATE HEALTH INSURANCE | Admitting: Oncology

## 2011-09-29 VITALS — BP 116/71 | HR 57 | Temp 97.1°F | Ht 71.0 in | Wt 182.5 lb

## 2011-09-29 DIAGNOSIS — C01 Malignant neoplasm of base of tongue: Secondary | ICD-10-CM

## 2011-09-29 DIAGNOSIS — K117 Disturbances of salivary secretion: Secondary | ICD-10-CM

## 2011-09-29 DIAGNOSIS — C109 Malignant neoplasm of oropharynx, unspecified: Secondary | ICD-10-CM

## 2011-09-29 NOTE — Progress Notes (Signed)
[facility] OFFICE PROGRESS NOTE  No primary provider on file.  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  PAST THERAPY:  Induction chemotherapy Cisplatin/Taxotere/5FU started on 09/21/2009.  He received concurrent chemoXRT with weekly Carboplatin which finished on 01/26/2010.  CURRENT THERAPY:  watchful observation.   INTERVAL HISTORY: Shane Webster 50 y.o. male returns for regular follow up with his wife.  He has residual xerostomia; however, he denies dysphagia, odynophagia, nausea/vomiting, weight loss, anorexia.  He is due to finish school in a few months.  He denies fatigue, palpable nodes, chest pain, abd pain.  He does not smoke or drink.  Patient denies fatigue, headache, visual changes, confusion, drenching night sweats, palpable lymph node swelling, mucositis, odynophagia, dysphagia, nausea vomiting, jaundice, chest pain, palpitation, shortness of breath, dyspnea on exertion, productive cough, gum bleeding, epistaxis, hematemesis, hemoptysis, abdominal pain, abdominal swelling, early satiety, melena, hematochezia, hematuria, skin rash, spontaneous bleeding, joint swelling, joint pain, heat or cold intolerance, bowel bladder incontinence, back pain, focal motor weakness, paresthesia, depression, suicidal or homocidal ideation, feeling hopelessness.   MEDICAL HISTORY: Past Medical History  Diagnosis Date  . MALIGNANT NEOPLASM OROPHARYNX UNSPECIFIED SITE 05/18/2010  . PULMONARY NODULE, RIGHT UPPER LOBE 05/18/2010    SURGICAL HISTORY: No past surgical history on file.  MEDICATIONS: No current outpatient prescriptions on file.    ALLERGIES:   has no known allergies.  REVIEW OF SYSTEMS:  The rest of the 14-point review of system was negative.   Filed Vitals:   09/29/11 1339  BP: 116/71  Pulse: 57  Temp: 97.1 F (36.2 C)   Wt Readings from Last 3 Encounters:  09/29/11 182 lb 8 oz (82.781 kg)    04/04/11 175 lb 3.2 oz (79.47 kg)  05/17/10 172 lb (78.019 kg)   ECOG Performance status:  0  PHYSICAL EXAMINATION:   General:  well-nourished in no acute distress.  Eyes:  no scleral icterus.  ENT:  There were no oropharyngeal lesions.  Neck was without thyromegaly.  Lymphatics:  Negative cervical, supraclavicular or axillary adenopathy.  Respiratory: lungs were clear bilaterally without wheezing or crackles.  Cardiovascular:  Regular rate and rhythm, S1/S2, without murmur, rub or gallop.  There was no pedal edema.  GI:  abdomen was soft, flat, nontender, nondistended, without organomegaly.  Muscoloskeletal:  no spinal tenderness of palpation of vertebral spine.  Skin exam was without echymosis, petichae.  Neuro exam was nonfocal.  Patient was able to get on and off exam table without assistance.  Gait was normal.  Patient was alerted and oriented.  Attention was good.   Language was appropriate.  Mood was normal without depression.  Speech was not pressured.  Thought content was not tangential.     LABORATORY/RADIOLOGY DATA:  Lab Results  Component Value Date   WBC 7.1 09/23/2011   HGB 13.6 09/23/2011   HCT 41.0 09/23/2011   PLT 174 09/23/2011   GLUCOSE 78 09/23/2011   ALT 12 08/09/2010   AST 29 09/23/2011   NA 134 09/23/2011   K 4.6 09/23/2011   CL 101 09/23/2011   CREATININE 1.1 09/23/2011   BUN 20 09/23/2011   CO2 30 09/23/2011   INR 1.13 08/19/2009    IMAGING:  I personally reviewed the following CT neck and CXR which did not show evidence of recurrent or metastatic disease.   Dg Chest 2 View  09/23/2011  *RADIOLOGY REPORT*  Clinical Data: Tongue cancer   CHEST - 2  VIEW  Comparison: CT 08/04/2010  Findings: Cardiomediastinal silhouette is stable.  No acute infiltrate or pleural effusion.  No pulmonary edema.  Bony thorax is unremarkable.  IMPRESSION: No active disease.  Original Report Authenticated By: Natasha Mead, M.D.   Ct Soft Tissue Neck W Contrast  09/23/2011  *RADIOLOGY REPORT*   Clinical Data: History of left-sided tongue base tumor.  CT NECK WITH CONTRAST  Technique:  Multidetector CT imaging of the neck was performed with intravenous contrast.  Contrast: OMNIPAQUE IOHEXOL 300 MG/ML IV SOLN  Comparison: CT neck 03/28/2011.  Findings: Post-treatment changes of the left tongue base are again noted.  There is no evidence for residual or recurrent tumor. Asymmetric left-sided atrophy is stable.  No focal mucosal or submucosal lesions are present.  The vocal cords are midline and symmetric.  The thyroid is unremarkable.  No significant to cervical adenopathy is present.  The bone windows demonstrate stable mild degenerative change. Radiation changes of the lung apices are stable.  The lungs are otherwise clear.  IMPRESSION:  1.  No evidence for residual or recurrent disease. 2.  Stable post-treatment changes of the left tongue base and left- sided tongue atrophy.  Original Report Authenticated By: Jamesetta Orleans. MATTERN, M.D.   ASSESSMENT AND PLAN:   1. History of oropharyngeal squamous cell carcinoma:  I discussed with Mr. Shane Webster and his wife  that there is no evidence of disease recurrence based on disease and clinical history, physical exam, laboratory tests, and imaging modality.  As he will be 2 years out when I see him next time in 6 moths, there is no indication for repeat scan unless he has symptoms.  2. History of lung nodule, resolved on the last CT.  CXR today did not show concerning findings.  3. Primary care:  He has never had a colonoscopy and I encouraged him to follow through with a screening colonoscopy with Dr. Arlyce Dice in the near future.  I advised him on the pros and cons of influenza vaccination; he declined.  4. Follow up with me in about 6 months.  I advised him to follow up with ENT and Rad Onc as well.

## 2011-09-29 NOTE — Telephone Encounter (Signed)
gve the pt his June 2013 appt calendar °

## 2011-10-12 ENCOUNTER — Other Ambulatory Visit: Payer: Self-pay | Admitting: Oncology

## 2011-10-16 ENCOUNTER — Telehealth: Payer: Self-pay | Admitting: Oncology

## 2011-10-16 NOTE — Telephone Encounter (Signed)
Mailed the pt his June 2013 appt calendar along with the ct scan appt with instructions.

## 2012-03-23 ENCOUNTER — Telehealth: Payer: Self-pay | Admitting: Oncology

## 2012-03-23 NOTE — Telephone Encounter (Signed)
called pt to r/s appt time for 06/12 and pt asked that appts be cancelled because he was on his way to [place]. pt stated that he will rtn call to r/s

## 2012-03-27 ENCOUNTER — Other Ambulatory Visit (HOSPITAL_COMMUNITY): Payer: PRIVATE HEALTH INSURANCE

## 2012-03-27 ENCOUNTER — Other Ambulatory Visit: Payer: PRIVATE HEALTH INSURANCE | Admitting: Lab

## 2012-03-28 ENCOUNTER — Ambulatory Visit: Payer: PRIVATE HEALTH INSURANCE | Admitting: Oncology

## 2012-03-28 ENCOUNTER — Other Ambulatory Visit: Payer: PRIVATE HEALTH INSURANCE | Admitting: Lab

## 2012-04-02 ENCOUNTER — Other Ambulatory Visit: Payer: PRIVATE HEALTH INSURANCE | Admitting: Lab

## 2012-04-16 ENCOUNTER — Ambulatory Visit: Payer: PRIVATE HEALTH INSURANCE | Admitting: Oncology

## 2014-08-19 ENCOUNTER — Telehealth (HOSPITAL_COMMUNITY): Payer: Self-pay | Admitting: Dentistry

## 2014-08-19 NOTE — Telephone Encounter (Signed)
08/19/2014  Patient:            Shane Webster Date of Birth:  [birth year] MRN:                [id number]   Patient stopped by the clinic and discussed having RCT versus extraction of mandibular anterior tooth. I indicated that I would call the patient back after review of his chart. I performed chart review. Patient is ok to have lower anterior tooth pulled. I then called the patient at home and discussed the extraction with the patient. Patient is also contemplating having remaining teeth extracted. I informed the patient that he should have the providing dentist call Dr. Lisbeth Renshaw to discuss previous radiation therapy doses and potential risk for osteoradionecrosis.  Patient expressed understanding. Dr. Enrique Sack

## 2014-08-20 ENCOUNTER — Telehealth: Payer: Self-pay | Admitting: *Deleted

## 2014-08-20 NOTE — Telephone Encounter (Signed)
Returned  Call to patient after voice message left on my phone, patient asking if he could  Have all his teeth pulled ,he was treated on his base of tongue 5 years ago here ion [place],also had chemotherapy, he saw Dr.Kulinski yesterday and was advised to call  Dr.Moody to see if any potemntial risk for osteoradionecrosis, will inform Dr. Lisbeth Renshaw and will call patient back later today,patient said  Thank you for calling back so soon,awaiting status 10:37 AM

## 2014-08-21 ENCOUNTER — Encounter: Payer: Self-pay | Admitting: Radiation Oncology

## 2014-08-21 NOTE — Progress Notes (Signed)
I have had a chance to review the patient's radiation treatment. The oral cavity including the teeth both upper and lower were spared quite well. Although I believe that he would be at increased risk above someone who did not receive radiation treatment, his risk of difficulties with further necessary dental work should be acceptable. I discussed this with the patient and he will let us know if there is anything else that we can do.

## 2016-05-20 ENCOUNTER — Encounter: Payer: Self-pay | Admitting: Hematology and Oncology

## 2016-05-20 ENCOUNTER — Other Ambulatory Visit (HOSPITAL_BASED_OUTPATIENT_CLINIC_OR_DEPARTMENT_OTHER): Payer: BLUE CROSS/BLUE SHIELD

## 2016-05-20 ENCOUNTER — Telehealth: Payer: Self-pay | Admitting: Hematology and Oncology

## 2016-05-20 ENCOUNTER — Ambulatory Visit (HOSPITAL_BASED_OUTPATIENT_CLINIC_OR_DEPARTMENT_OTHER): Payer: BLUE CROSS/BLUE SHIELD | Admitting: Hematology and Oncology

## 2016-05-20 VITALS — BP 120/88 | HR 98 | Temp 97.8°F | Resp 19 | Wt 185.4 lb

## 2016-05-20 DIAGNOSIS — N529 Male erectile dysfunction, unspecified: Secondary | ICD-10-CM

## 2016-05-20 DIAGNOSIS — Z85818 Personal history of malignant neoplasm of other sites of lip, oral cavity, and pharynx: Secondary | ICD-10-CM

## 2016-05-20 DIAGNOSIS — R682 Dry mouth, unspecified: Secondary | ICD-10-CM

## 2016-05-20 DIAGNOSIS — K130 Diseases of lips: Secondary | ICD-10-CM | POA: Insufficient documentation

## 2016-05-20 DIAGNOSIS — E039 Hypothyroidism, unspecified: Secondary | ICD-10-CM | POA: Diagnosis not present

## 2016-05-20 DIAGNOSIS — H9193 Unspecified hearing loss, bilateral: Secondary | ICD-10-CM

## 2016-05-20 DIAGNOSIS — K117 Disturbances of salivary secretion: Secondary | ICD-10-CM

## 2016-05-20 LAB — CBC WITH DIFFERENTIAL/PLATELET
BASO%: 0.2 % (ref 0.0–2.0)
BASOS ABS: 0 10*3/uL (ref 0.0–0.1)
EOS%: 6.6 % (ref 0.0–7.0)
Eosinophils Absolute: 0.4 10*3/uL (ref 0.0–0.5)
HCT: 44.8 % (ref 38.4–49.9)
HEMOGLOBIN: 14.4 g/dL (ref 13.0–17.1)
LYMPH%: 8.4 % — ABNORMAL LOW (ref 14.0–49.0)
MCH: 25.5 pg — AB (ref 27.2–33.4)
MCHC: 32.1 g/dL (ref 32.0–36.0)
MCV: 79.5 fL (ref 79.3–98.0)
MONO#: 0.6 10*3/uL (ref 0.1–0.9)
MONO%: 9.1 % (ref 0.0–14.0)
NEUT#: 5 10*3/uL (ref 1.5–6.5)
NEUT%: 75.7 % — AB (ref 39.0–75.0)
Platelets: 236 10*3/uL (ref 140–400)
RBC: 5.63 10*6/uL (ref 4.20–5.82)
RDW: 13.2 % (ref 11.0–14.6)
WBC: 6.6 10*3/uL (ref 4.0–10.3)
lymph#: 0.6 10*3/uL — ABNORMAL LOW (ref 0.9–3.3)

## 2016-05-20 LAB — COMPREHENSIVE METABOLIC PANEL
ALT: 13 U/L (ref 0–55)
AST: 21 U/L (ref 5–34)
Albumin: 3.9 g/dL (ref 3.5–5.0)
Alkaline Phosphatase: 122 U/L (ref 40–150)
Anion Gap: 11 mEq/L (ref 3–11)
BUN: 11.6 mg/dL (ref 7.0–26.0)
CHLORIDE: 105 meq/L (ref 98–109)
CO2: 27 mEq/L (ref 22–29)
Calcium: 9.9 mg/dL (ref 8.4–10.4)
Creatinine: 0.8 mg/dL (ref 0.7–1.3)
EGFR: >90 mL/min/{1.73_m2} (ref >90)
GLUCOSE: 97 mg/dL (ref 70–140)
POTASSIUM: 4.5 meq/L (ref 3.5–5.1)
SODIUM: 142 meq/L (ref 136–145)
Total Bilirubin: 0.33 mg/dL (ref 0.20–1.20)
Total Protein: 7.6 g/dL (ref 6.4–8.3)

## 2016-05-20 LAB — TSH: TSH: 4.832 m[IU]/L — ABNORMAL HIGH (ref 0.320–4.118)

## 2016-05-20 NOTE — Assessment & Plan Note (Signed)
The patient had significant hair loss. Suspect he has hypothyroidism. I will order thyroid function tests and follow and will order replacement therapy if needed

## 2016-05-20 NOTE — Assessment & Plan Note (Signed)
His wife thinks that patient may have chronic hearing problem. Again, recommend follow-up with ENT for formal hearing evaluation. He might be the late complication from his prior treatment

## 2016-05-20 NOTE — Assessment & Plan Note (Signed)
He complained of erectile dysfunction. I recommend referral to primary care doctor for further management before urologist review

## 2016-05-20 NOTE — Assessment & Plan Note (Signed)
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 We discussed medical management that the patient would like to continue conservative approach with increased oral fluid intake 

## 2016-05-20 NOTE — Assessment & Plan Note (Signed)
Clinically, he has no signs of cancer recurrence. I recommend close follow-up with ENT. At the pictures of the spot on his lip and recommend he discuss with Dr. Constance Holster for possible biopsy.

## 2016-05-20 NOTE — Assessment & Plan Note (Signed)
The lip lesion benign. However, recommend follow-up with ENT in the near future

## 2016-05-20 NOTE — Telephone Encounter (Signed)
Gv pt appt for 05/19/17 with gretchen per 8/4 pof. Pt sent to lab today.

## 2016-05-20 NOTE — Progress Notes (Addendum)
[place] NOTE  Patient Care Team: Izora Gala, MD as Attending Physician Kyung Rudd, MD (Radiation Oncology) Heath Lark, MD as Consulting Physician (Hematology and Oncology)  CHIEF COMPLAINTS/PURPOSE OF CONSULTATION:  History of tonsil cancer, for further management  HISTORY OF PRESENTING ILLNESS:  Shane Webster 55 y.o. male is here because of prior diagnosis of tonsil cancer. The patient made the referral himself. He had prior diagnosis of tonsil cancer after he felt a lump. He was treated by another physician who has left the practice. 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  PAST THERAPY:  Induction chemotherapy Cisplatin/Taxotere/5FU started on 09/21/2009.  He received concurrent chemoXRT with weekly Carboplatin which finished on 01/26/2010. The patient complains of chronic dry mouth and occasional dysphagia. He had complete dental extraction with dentures recently. He denies new lymphadenopathy. He complained of recent hair loss and his wife thought he may have hearing problems. He does not have a primary care doctor. He has not been seen by ENT for a while. He complained of erectile dysfunction.  MEDICAL HISTORY:  Past Medical History:  Diagnosis Date  . MALIGNANT NEOPLASM OROPHARYNX UNSPECIFIED SITE 05/18/2010  . PULMONARY NODULE, RIGHT UPPER LOBE 05/18/2010    SURGICAL HISTORY: Past Surgical History:  Procedure Laterality Date  . COLONOSCOPY    . DENTAL RESTORATION/EXTRACTION WITH X-RAY    . PEG TUBE REMOVAL    . PORT-A-CATH REMOVAL      SOCIAL HISTORY: Social History   Social History  . Marital status: Married    Spouse name: N/A  . Number of children: 2  . Years of education: N/A   Occupational History  . office work    Social History Main Topics  . Smoking status: Former Smoker    Packs/day: 0.50    Years: 10.00    Quit date:  05/20/2001  . Smokeless tobacco: Never Used  . Alcohol use No  . Drug use: No  . Sexual activity: Not on file   Other Topics Concern  . Not on file   Social History Narrative   2 daughters    FAMILY HISTORY: Family History  Problem Relation Age of Onset  . Cancer Father     gastric  . Cancer Sister     cancer    ALLERGIES:  has No Known Allergies.  MEDICATIONS:  No current outpatient prescriptions on file.   No current facility-administered medications for this visit.     REVIEW OF SYSTEMS:   Constitutional: Denies fevers, chills or abnormal night sweats Eyes: Denies blurriness of vision, double vision or watery eyes Ears, nose, mouth, throat, and face: Denies mucositis or sore throat Respiratory: Denies cough, dyspnea or wheezes Cardiovascular: Denies palpitation, chest discomfort or lower extremity swelling Gastrointestinal:  Denies nausea, heartburn or change in bowel habits Skin: Denies abnormal skin rashes Lymphatics: Denies new lymphadenopathy or easy bruising Neurological:Denies numbness, tingling or new weaknesses Behavioral/Psych: Mood is stable, no new changes  All other systems were reviewed with the patient and are negative.  PHYSICAL EXAMINATION: ECOG PERFORMANCE STATUS: 0 - Asymptomatic  Vitals:   05/20/16 1107  BP: 120/88  Pulse: 98  Resp: 19  Temp: 97.8 F (36.6 C)   Filed Weights   05/20/16 1107  Weight: 185 lb 6.4 oz (84.1 kg)    GENERAL:alert, no distress and comfortable SKIN: skin color, texture, turgor are normal, no rashes or significant lesions EYES: normal,  conjunctiva are pink and non-injected, sclera clear OROPHARYNX:no exudate, no erythema, buccal mucosa, and tongue normal. Noted lip lesion NECK: supple, thyroid normal size, non-tender, without nodularity LYMPH:  no palpable lymphadenopathy in the cervical, axillary or inguinal LUNGS: clear to auscultation and percussion with normal breathing effort HEART: regular rate &  rhythm and no murmurs and no lower extremity edema ABDOMEN:abdomen soft, non-tender and normal bowel sounds Musculoskeletal:no cyanosis of digits and no clubbing  PSYCH: alert & oriented x 3 with fluent speech NEURO: no focal motor/sensory deficits  LABORATORY DATA:  I have reviewed the data as listed Lab Results  Component Value Date   WBC 6.6 05/20/2016   HGB 14.4 05/20/2016   HCT 44.8 05/20/2016   MCV 79.5 05/20/2016   PLT 236 05/20/2016    Recent Labs  05/20/16 1206  NA 142  K 4.5  CO2 27  GLUCOSE 97  BUN 11.6  CREATININE 0.8  CALCIUM 9.9  PROT 7.6  ALBUMIN 3.9  AST 21  ALT 13  ALKPHOS 122  BILITOT 0.33    ASSESSMENT & PLAN:  History of cancer tonsil Clinically, he has no signs of cancer recurrence. I recommend close follow-up with ENT. At the pictures of the spot on his lip and recommend he discuss with Dr. Constance Holster for possible biopsy.   Acquired hypothyroidism The patient had significant hair loss. Suspect he has hypothyroidism. I will order thyroid function tests and follow and will order replacement therapy if needed  Erectile dysfunction He complained of erectile dysfunction. I recommend referral to primary care doctor for further management before urologist review  Lip lesion The lip lesion benign. 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 We discussed medical management that the patient would like to continue conservative approach with increased oral fluid intake  Hearing loss of both ears His wife thinks that patient may have chronic hearing problem. Again, recommend follow-up with ENT for formal hearing evaluation. He might be the late complication from his prior treatment  Orders Placed This Encounter  Procedures  . CBC with Differential/Platelet    Standing Status:   Future    Number of Occurrences:   1    Standing Expiration Date:   06/24/2017  .  Comprehensive metabolic panel    Standing Status:   Future    Number of Occurrences:   1    Standing Expiration Date:   06/24/2017  . TSH    Standing Status:   Future    Number of Occurrences:   1    Standing Expiration Date:   06/24/2017  . T4    Standing Status:   Future    Number of Occurrences:   1    Standing Expiration Date:   06/24/2017  . Amb Referral to Survivorship Long term    Referral Priority:   Routine    Referral Type:   Consultation    Referred to Provider:   Holley Bouche, NP    Number of Visits Requested:   1     All questions were answered. The patient knows to call the clinic with any problems, questions or concerns. I spent 55 minutes counseling the patient face to face. The total time spent in the appointment was 60 minutes and more than 50% was on counseling.     [facility], Jahanna Raether, MD 05/20/2016 5:51 PM

## 2016-05-21 LAB — T4: Thyroxine (T4): 6.7 ug/dL (ref 4.5–12.0)

## 2016-05-23 ENCOUNTER — Telehealth: Payer: Self-pay | Admitting: *Deleted

## 2016-05-23 NOTE — Telephone Encounter (Signed)
-----   Message from Heath Lark, MD sent at 05/23/2016 10:14 AM EDT ----- Regarding: labs ok Please inform him labs and thyroid functions are OK ----- Message ----- From: Interface, Lab In Three Zero One Sent: 05/20/2016  12:16 PM To: Heath Lark, MD

## 2016-05-23 NOTE — Telephone Encounter (Signed)
Informed pt of Dr. Calton Dach message labs, including thyroid function are ok.  He verbalized understanding.

## 2017-03-08 ENCOUNTER — Telehealth: Payer: Self-pay | Admitting: Hematology and Oncology

## 2017-03-08 NOTE — Telephone Encounter (Signed)
Faxed records to [facility] [phone]

## 2017-05-19 ENCOUNTER — Encounter: Payer: BLUE CROSS/BLUE SHIELD | Admitting: Adult Health

## 2017-05-22 ENCOUNTER — Ambulatory Visit (HOSPITAL_BASED_OUTPATIENT_CLINIC_OR_DEPARTMENT_OTHER): Payer: 59 | Admitting: Adult Health

## 2017-05-22 ENCOUNTER — Encounter: Payer: Self-pay | Admitting: Adult Health

## 2017-05-22 VITALS — BP 118/74 | HR 59 | Temp 97.7°F | Resp 20 | Ht 71.0 in | Wt 178.2 lb

## 2017-05-22 DIAGNOSIS — Z85818 Personal history of malignant neoplasm of other sites of lip, oral cavity, and pharynx: Secondary | ICD-10-CM | POA: Diagnosis not present

## 2017-05-22 NOTE — Progress Notes (Signed)
CLINIC:  Survivorship  REASON FOR VISIT:  Routine follow-up for history of head & neck cancer.  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  PAST THERAPY: Induction chemotherapy Cisplatin/Taxotere/5FU started on 09/21/2009. He received concurrent chemoXRT with weekly Carboplatin which finished on 01/26/2010.    INTERVAL HISTORY:  Shane Webster was seen last one year ago.  He has been doing quite well since his appt with Dr. Alvy Bimler.  He recently had some thyroid testing that was off.  His PCP has been following this.  He is working this year at [employer] and just started working in November, 2017.  He does have chronic dysphagia (noted below).  -Pain: none -Nutrition/Diet: Soft foods, eats 60% of two to three meals per day, drinks ensure almost daily -Dysphagia?: +, chronic and unchanged, dietary modification helps -Dental issues?: new dentures, two teeth left using fluoride trays? No, but has special toothpaste -Last TSH: two weeks ago by PCP -Weight: LOSS since 05/20/2016 of 7 pounds   -Last ENT visit:  Not seen this year -Last Rad Onc visit: Not this year -Last Dentist visit: 04/2017    ADDITIONAL REVIEW OF SYSTEMS:  Review of Systems  Constitutional: Positive for weight loss. Negative for chills, fever and malaise/fatigue.  HENT: Negative for congestion, ear pain, hearing loss, sore throat and tinnitus.   Eyes: Negative for blurred vision and double vision.  Respiratory: Negative for cough and shortness of breath.   Cardiovascular: Negative for chest pain, palpitations and leg swelling.  Gastrointestinal: Negative for constipation, diarrhea, heartburn, nausea and vomiting.  Genitourinary: Negative for dysuria and urgency.  Skin: Negative for itching and rash.  Neurological: Negative for dizziness, weakness and headaches.  Endo/Heme/Allergies: Does not bruise/bleed easily.    Psychiatric/Behavioral: Negative for depression. The patient is not nervous/anxious.       CURRENT MEDICATIONS:  No current outpatient prescriptions on file prior to visit.   No current facility-administered medications on file prior to visit.    He is not taking any medications.  ALLERGIES:  No Known Allergies   PHYSICAL EXAM:  Vitals:   05/22/17 1150  BP: 118/74  Pulse: (!) 59  Resp: 20  Temp: 97.7 F (36.5 C)   Filed Weights   05/22/17 1150  Weight: 178 lb 3.2 oz (80.8 kg)     General: Well-nourished, well-appearing male in no acute distress.  Accompanied by his wife today.  HEENT: Head is atraumatic and normocephalic.  Pupils equal and reactive to light. Conjunctivae clear without exudate.  Sclerae anicteric. Oral mucosa is pink and slightly dry without lesions.  Tongue pink, slightly dry, and midline. Oropharynx is pink and slightly dry, without lesions. Lymph: No preauricular, postauricular, cervical, supraclavicular, or infraclavicular lymphadenopathy noted on palpation.   Neck: No palpable masses. No thyromegaly.  Cardiovascular: Normal rate and rhythm. Respiratory: Clear to auscultation bilaterally. Chest expansion symmetric without accessory muscle use; breathing non-labored.  GI: Abdomen soft and round. Non-tender, non-distended. Bowel sounds normoactive.  GU: Deferred.   Neuro: No focal deficits. Steady gait.   Psych: Normal mood and affect for situation. Extremities: No edema.  Skin: Warm and dry.    LABORATORY DATA:  None at this visit.  DIAGNOSTIC IMAGING:  None at this visit.    ASSESSMENT & PLAN:  Shane Webster is a pleasant 56 y.o. male with history of tonsillar carcinoma, diagnosed in 2010;  treated with chemoXRT; completed treatment on 01/26/2010.  Patient presents to survivorship  clinic today for routine follow-up after finishing treatment.   1. Tonsil Cancer:  Shane Webster is clinically without evidence of disease or recurrence on physical exam  today.     2. Nutritional status: Shane Webster reports that he is currently able to consume adequate nutrition by mouth utilizing a soft diet.  Weight is slightly down at 178 lbs today.  Encouraged to continue to consume adequate hydration and nutrition, as tolerated.  He thinks his weight loss is due to going back to work and being more active.  3. At risk for dysphagia: Given Shane Webster treatment for tonsil cancer, which included chemotherapy and radiation therapy, he is at risk for chronic dysphagia.  he is only able to eat and swallow soft foods, and swallows thin liquids without difficulty.  Due to his chronic issues, I referred him for speech therapy evaluation.      4.  At risk for neck lymphedema:  When patients with head & neck cancers are treated with surgery and/or radiation therapy, there is an associated increased risk of neck lymphedema. He currently does not have any symptoms and has not since his diagnosis.     5.  At risk for hypothyroidism: The thyroid gland is often affected after treatment for head & neck cancer.  Shane Webster most recent TSH was abnormal at one year ago per our records.  His PCP, however has been following his thyroid.  Shane Webster signed a release form so we can get records of his last TSH values.   6. At risk for tooth decay/dental concerns: After treatment with radiation for head & neck cancers, patients often experience xerostomia which increases their risk of dental caries. Shane Webster was encouraged to see his dentist 3-4 times per year, which he has been. He continues to use the prescription toothpaste ordered by his dentist.  I encouraged him to reach out to either Dr. Enrique Sack (oncology dentist) or his primary care dentist with additional questions or concerns.   7. Health maintenance and wellness promotion: Cancer patients who consume a diet rich in fruits and vegetables have better overall health and decreased risk of cancer recurrence. Shane Webster was encouraged  to consume 5-7 servings of fruits and vegetables per day, as tolerated. Shane Webster was also encouraged to engage in moderate to vigorous exercise for 30 minutes per day most days of the week.        Dispo:  -See Dr. Constance Holster in the next couple of months -Return to cancer center to see Dr. Alvy Bimler in one year -Undergo SLP eval for dysphagia evaluation     A total of 30 minutes was spent in the face-to-face care of this patient, with greater than 50% of that time spent in counseling and care-coordination.    Gardenia Phlegm, [place] [phone]

## 2017-06-15 ENCOUNTER — Telehealth: Payer: Self-pay | Admitting: *Deleted

## 2017-06-15 NOTE — Telephone Encounter (Signed)
Oncology Nurse [facility] to coordinate follow-up appt.  Spoke with Janett Billow, requested appt for early October with Dr. Constance Holster.  She voiced understanding, will call patient to arrange.  Gayleen Orem, RN, BSN, [place] [facility] Nurse [facility] [phone]

## 2018-05-21 ENCOUNTER — Other Ambulatory Visit: Payer: Self-pay | Admitting: Hematology and Oncology

## 2018-05-21 DIAGNOSIS — Z85818 Personal history of malignant neoplasm of other sites of lip, oral cavity, and pharynx: Secondary | ICD-10-CM

## 2018-05-21 DIAGNOSIS — E039 Hypothyroidism, unspecified: Secondary | ICD-10-CM

## 2018-05-22 ENCOUNTER — Ambulatory Visit: Payer: 59 | Admitting: Hematology and Oncology

## 2018-05-25 ENCOUNTER — Inpatient Hospital Stay: Payer: 59 | Attending: Hematology and Oncology | Admitting: Hematology and Oncology

## 2018-05-25 ENCOUNTER — Encounter: Payer: Self-pay | Admitting: Hematology and Oncology

## 2018-05-25 DIAGNOSIS — R1310 Dysphagia, unspecified: Secondary | ICD-10-CM | POA: Diagnosis not present

## 2018-05-25 DIAGNOSIS — K117 Disturbances of salivary secretion: Secondary | ICD-10-CM | POA: Diagnosis not present

## 2018-05-25 DIAGNOSIS — Z85818 Personal history of malignant neoplasm of other sites of lip, oral cavity, and pharynx: Secondary | ICD-10-CM | POA: Diagnosis not present

## 2018-05-25 DIAGNOSIS — R682 Dry mouth, unspecified: Secondary | ICD-10-CM

## 2018-05-25 NOTE — Assessment & Plan Note (Signed)
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 We discussed medical management that the patient would like to continue conservative approach with increased oral fluid intake

## 2018-05-25 NOTE — Assessment & Plan Note (Signed)
Clinically, he has no signs of cancer recurrence. I recommend close follow-up with ENT. Since he is a long-term cancer survivor, I will discharge the patient from the clinic I reinforced the importance of regular dental visit and primary care doctor visit for annual TSH monitoring.  He is reminded not to smoke or drink as those are risk factors for cancer recurrence

## 2018-05-25 NOTE — Assessment & Plan Note (Signed)
He has chronic dysphagia especially with meat products of bread However, he is able to adequately hydrate himself and take nutritional supplement without difficulties We discussed the risk and benefits of esophageal dilation procedures and he declined

## 2018-05-25 NOTE — Progress Notes (Signed)
Shane Webster OFFICE PROGRESS NOTE  Patient Care Team: Tonye Becket., MD as PCP - General (Family Medicine) Shane Gala, MD as Attending Physician Shane Rudd, MD (Radiation Oncology) Heath Lark, MD as Consulting Physician (Hematology and Oncology)  ASSESSMENT & PLAN:  History of cancer tonsil Clinically, he has no signs of cancer recurrence. I recommend close follow-up with ENT. Since he is a long-term cancer survivor, I will discharge the patient from the clinic I reinforced the importance of regular dental visit and primary care doctor visit for annual TSH monitoring.  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 We discussed medical management that the patient would like to continue conservative approach with increased oral fluid intake  Dysphagia He has chronic dysphagia especially with meat products of bread However, he is able to adequately hydrate himself and take nutritional supplement without difficulties We discussed the risk and benefits of esophageal dilation procedures and he declined    No orders of the defined types were placed in this encounter.   INTERVAL HISTORY: Please see below for problem oriented charting. 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  PAST THERAPY:  Induction chemotherapy Cisplatin/Taxotere/5FU started on 09/21/2009. He received concurrent chemoXRT with weekly Carboplatin which finished on 01/26/2010.  REVIEW OF SYSTEMS:   Constitutional: Denies fevers, chills or abnormal weight loss Eyes: Denies blurriness of vision Ears, nose, mouth, throat, and face: Denies mucositis or sore throat Respiratory: Denies cough, dyspnea or wheezes Cardiovascular: Denies palpitation, chest discomfort or lower extremity swelling Gastrointestinal:  Denies nausea, heartburn or change in bowel habits Skin: Denies abnormal skin rashes Lymphatics: Denies new lymphadenopathy or easy bruising Neurological:Denies numbness, tingling or new weaknesses Behavioral/Psych: Mood is stable, no new changes  All other systems were reviewed with the patient and are negative.  I have reviewed the past medical history, past surgical history, social history and family history with the patient and they are unchanged from previous note.  ALLERGIES:  has No Known Allergies.  MEDICATIONS:  No current outpatient medications on file.   No current facility-administered medications for this visit.     PHYSICAL EXAMINATION: ECOG PERFORMANCE STATUS: 1 - Symptomatic but completely ambulatory  Vitals:   05/25/18 1140  BP: (!) 132/93  Pulse: 62  Resp: 18  Temp: 98 F (36.7 C)  SpO2: 100%   Filed Weights   05/25/18 1140  Weight: 172 lb 3.2 oz (78.1 kg)    GENERAL:alert, no distress and comfortable SKIN: skin color, texture, turgor are normal, no rashes or significant lesions EYES: normal, Conjunctiva are pink and non-injected, sclera clear OROPHARYNX:no exudate, no erythema and lips, buccal mucosa, and tongue normal  NECK: supple, thyroid normal size, non-tender, without nodularity LYMPH:  no palpable lymphadenopathy in the cervical, axillary or inguinal LUNGS: clear to auscultation and percussion with normal breathing effort HEART: regular rate & rhythm and  no  murmurs and no lower extremity edema ABDOMEN:abdomen soft, non-tender and normal bowel sounds Musculoskeletal:no cyanosis of digits and no clubbing  NEURO: alert & oriented x 3 with fluent speech, no focal motor/sensory deficits  LABORATORY DATA:  I have reviewed the data as listed    Component Value Date/Time   NA 142 05/20/2016 1206   K 4.5 05/20/2016 1206   CL 101 09/23/2011 1418   CO2 27 05/20/2016 1206   GLUCOSE 97 05/20/2016 1206   GLUCOSE 78 09/23/2011 1418   BUN 11.6 05/20/2016 1206   CREATININE 0.8 05/20/2016 1206   CALCIUM 9.9 05/20/2016 1206   PROT 7.6 05/20/2016 1206   ALBUMIN 3.9 05/20/2016 1206   AST 21 05/20/2016 1206   ALT 13 05/20/2016 1206   ALKPHOS 122 05/20/2016 1206   BILITOT 0.33 05/20/2016 1206   GFRNONAA >60 08/21/2009 0405   GFRAA  08/21/2009 0405    >60        The eGFR has been calculated using the MDRD equation. This calculation has not been validated in all clinical situations. eGFR's persistently <60 mL/min signify possible Chronic Kidney Disease.    No results found for: SPEP, UPEP  Lab Results  Component Value Date   WBC 6.6 05/20/2016   NEUTROABS 5.0 05/20/2016   HGB 14.4 05/20/2016   HCT 44.8 05/20/2016   MCV 79.5 05/20/2016   PLT 236 05/20/2016      Chemistry      Component Value Date/Time   NA 142 05/20/2016 1206   K 4.5 05/20/2016 1206   CL 101 09/23/2011 1418   CO2 27 05/20/2016 1206   BUN 11.6 05/20/2016 1206   CREATININE 0.8 05/20/2016 1206      Component Value Date/Time   CALCIUM 9.9 05/20/2016 1206   ALKPHOS 122 05/20/2016 1206   AST 21 05/20/2016 1206   ALT 13 05/20/2016 1206   BILITOT 0.33 05/20/2016 1206       All questions were answered. The patient knows to call the clinic with any problems, questions or concerns. No barriers to learning was detected.  I spent 15 minutes counseling the patient face to face. The total time spent in the appointment was 20 minutes and more than 50% was on counseling and  review of test results  Heath Lark, MD 05/25/2018 2:04 PM

## 2020-08-17 ENCOUNTER — Other Ambulatory Visit: Payer: Self-pay | Admitting: Otolaryngology

## 2020-08-17 DIAGNOSIS — J029 Acute pharyngitis, unspecified: Secondary | ICD-10-CM

## 2020-08-17 DIAGNOSIS — H9201 Otalgia, right ear: Secondary | ICD-10-CM

## 2020-08-17 DIAGNOSIS — Z85819 Personal history of malignant neoplasm of unspecified site of lip, oral cavity, and pharynx: Secondary | ICD-10-CM

## 2020-08-20 ENCOUNTER — Ambulatory Visit
Admission: RE | Admit: 2020-08-20 | Discharge: 2020-08-20 | Disposition: A | Discharge status: Home / Self Care | Payer: 59 | Source: Ambulatory Visit | Attending: Otolaryngology | Admitting: Otolaryngology

## 2020-08-20 ENCOUNTER — Other Ambulatory Visit: Payer: Self-pay

## 2020-08-20 DIAGNOSIS — Z85819 Personal history of malignant neoplasm of unspecified site of lip, oral cavity, and pharynx: Secondary | ICD-10-CM

## 2020-08-20 DIAGNOSIS — J029 Acute pharyngitis, unspecified: Secondary | ICD-10-CM

## 2020-08-20 DIAGNOSIS — H9201 Otalgia, right ear: Secondary | ICD-10-CM

## 2020-08-20 MED ORDER — IOPAMIDOL (ISOVUE-300) INJECTION 61%
75.0000 mL | Freq: Once | INTRAVENOUS | Status: AC | PRN
  Administered 2020-08-20: 75 mL via INTRAVENOUS
